# Patient Record
Sex: Male | Born: 1970 | Race: Black or African American | Hispanic: No | Smoking: Never smoker
Health system: Southern US, Community
[De-identification: ages and names within clinical notes are randomized; demographics above are authoritative.]

## PROBLEM LIST (undated history)

## (undated) DIAGNOSIS — I10 Essential (primary) hypertension: Secondary | ICD-10-CM

## (undated) DIAGNOSIS — R7611 Nonspecific reaction to tuberculin skin test without active tuberculosis: Secondary | ICD-10-CM

---

## 2019-11-05 ENCOUNTER — Ambulatory Visit: Payer: Self-pay | Attending: Internal Medicine

## 2021-11-15 ENCOUNTER — Inpatient Hospital Stay (HOSPITAL_COMMUNITY)
Admission: EM | Admit: 2021-11-15 | Discharge: 2021-11-18 | DRG: 871 | Disposition: A | Discharge status: Home / Self Care | Payer: Managed Care, Other (non HMO) | Attending: Internal Medicine | Admitting: Internal Medicine

## 2021-11-15 ENCOUNTER — Emergency Department (HOSPITAL_COMMUNITY): Payer: Managed Care, Other (non HMO)

## 2021-11-15 ENCOUNTER — Other Ambulatory Visit: Payer: Self-pay

## 2021-11-15 ENCOUNTER — Encounter (HOSPITAL_COMMUNITY): Payer: Self-pay

## 2021-11-15 DIAGNOSIS — R0602 Shortness of breath: Secondary | ICD-10-CM | POA: Diagnosis not present

## 2021-11-15 DIAGNOSIS — I509 Heart failure, unspecified: Secondary | ICD-10-CM

## 2021-11-15 DIAGNOSIS — E66813 Obesity, class 3: Secondary | ICD-10-CM | POA: Diagnosis present

## 2021-11-15 DIAGNOSIS — A4189 Other specified sepsis: Principal | ICD-10-CM | POA: Diagnosis present

## 2021-11-15 DIAGNOSIS — E119 Type 2 diabetes mellitus without complications: Secondary | ICD-10-CM

## 2021-11-15 DIAGNOSIS — N182 Chronic kidney disease, stage 2 (mild): Secondary | ICD-10-CM | POA: Diagnosis present

## 2021-11-15 DIAGNOSIS — E1165 Type 2 diabetes mellitus with hyperglycemia: Secondary | ICD-10-CM

## 2021-11-15 DIAGNOSIS — I161 Hypertensive emergency: Secondary | ICD-10-CM | POA: Diagnosis present

## 2021-11-15 DIAGNOSIS — I5021 Acute systolic (congestive) heart failure: Secondary | ICD-10-CM | POA: Diagnosis present

## 2021-11-15 DIAGNOSIS — N179 Acute kidney failure, unspecified: Secondary | ICD-10-CM | POA: Diagnosis present

## 2021-11-15 DIAGNOSIS — I10 Essential (primary) hypertension: Secondary | ICD-10-CM

## 2021-11-15 DIAGNOSIS — E876 Hypokalemia: Secondary | ICD-10-CM | POA: Diagnosis present

## 2021-11-15 DIAGNOSIS — Z20822 Contact with and (suspected) exposure to covid-19: Secondary | ICD-10-CM | POA: Diagnosis present

## 2021-11-15 DIAGNOSIS — R0600 Dyspnea, unspecified: Secondary | ICD-10-CM | POA: Diagnosis present

## 2021-11-15 DIAGNOSIS — B9789 Other viral agents as the cause of diseases classified elsewhere: Secondary | ICD-10-CM | POA: Diagnosis present

## 2021-11-15 DIAGNOSIS — B9781 Human metapneumovirus as the cause of diseases classified elsewhere: Secondary | ICD-10-CM | POA: Diagnosis present

## 2021-11-15 DIAGNOSIS — E785 Hyperlipidemia, unspecified: Secondary | ICD-10-CM | POA: Diagnosis present

## 2021-11-15 DIAGNOSIS — R051 Acute cough: Secondary | ICD-10-CM

## 2021-11-15 DIAGNOSIS — Z6841 Body Mass Index (BMI) 40.0 and over, adult: Secondary | ICD-10-CM

## 2021-11-15 DIAGNOSIS — E1122 Type 2 diabetes mellitus with diabetic chronic kidney disease: Secondary | ICD-10-CM | POA: Diagnosis present

## 2021-11-15 DIAGNOSIS — B348 Other viral infections of unspecified site: Secondary | ICD-10-CM | POA: Diagnosis present

## 2021-11-15 DIAGNOSIS — I130 Hypertensive heart and chronic kidney disease with heart failure and stage 1 through stage 4 chronic kidney disease, or unspecified chronic kidney disease: Secondary | ICD-10-CM | POA: Diagnosis present

## 2021-11-15 HISTORY — DX: Nonspecific reaction to tuberculin skin test without active tuberculosis: R76.11

## 2021-11-15 HISTORY — DX: Essential (primary) hypertension: I10

## 2021-11-15 LAB — BASIC METABOLIC PANEL
Anion gap: 8 (ref 5–15)
BUN: 21 mg/dL — ABNORMAL HIGH (ref 6–20)
CO2: 27 mmol/L (ref 22–32)
Calcium: 9 mg/dL (ref 8.9–10.3)
Chloride: 102 mmol/L (ref 98–111)
Creatinine, Ser: 1.84 mg/dL — ABNORMAL HIGH (ref 0.61–1.24)
GFR, Estimated: 44 mL/min — ABNORMAL LOW (ref >60)
Glucose, Bld: 180 mg/dL — ABNORMAL HIGH (ref 70–99)
Potassium: 3.4 mmol/L — ABNORMAL LOW (ref 3.5–5.1)
Sodium: 137 mmol/L (ref 135–145)

## 2021-11-15 LAB — BRAIN NATRIURETIC PEPTIDE: B Natriuretic Peptide: 164.9 pg/mL — ABNORMAL HIGH (ref 0.0–100.0)

## 2021-11-15 LAB — CBC WITH DIFFERENTIAL/PLATELET
Abs Immature Granulocytes: 0.02 10*3/uL (ref 0.00–0.07)
Basophils Absolute: 0 10*3/uL (ref 0.0–0.1)
Basophils Relative: 1 %
Eosinophils Absolute: 0.1 10*3/uL (ref 0.0–0.5)
Eosinophils Relative: 2 %
HCT: 39.5 % (ref 39.0–52.0)
Hemoglobin: 13.8 g/dL (ref 13.0–17.0)
Immature Granulocytes: 0 %
Lymphocytes Relative: 20 %
Lymphs Abs: 1.1 10*3/uL (ref 0.7–4.0)
MCH: 30.5 pg (ref 26.0–34.0)
MCHC: 34.9 g/dL (ref 30.0–36.0)
MCV: 87.4 fL (ref 80.0–100.0)
Monocytes Absolute: 0.4 10*3/uL (ref 0.1–1.0)
Monocytes Relative: 7 %
Neutro Abs: 4 10*3/uL (ref 1.7–7.7)
Neutrophils Relative %: 70 %
Platelets: 127 10*3/uL — ABNORMAL LOW (ref 150–400)
RBC: 4.52 MIL/uL (ref 4.22–5.81)
RDW: 14.3 % (ref 11.5–15.5)
WBC: 5.7 10*3/uL (ref 4.0–10.5)
nRBC: 0 % (ref 0.0–0.2)

## 2021-11-15 LAB — RESP PANEL BY RT-PCR (FLU A&B, COVID) ARPGX2
Influenza A by PCR: NEGATIVE
Influenza B by PCR: NEGATIVE
SARS Coronavirus 2 by RT PCR: NEGATIVE

## 2021-11-15 LAB — TROPONIN I (HIGH SENSITIVITY)
Troponin I (High Sensitivity): 49 ng/L — ABNORMAL HIGH (ref <18)
Troponin I (High Sensitivity): 46 ng/L — ABNORMAL HIGH (ref <18)

## 2021-11-15 MED ORDER — POTASSIUM CHLORIDE CRYS ER 20 MEQ PO TBCR
40.0000 meq | EXTENDED_RELEASE_TABLET | Freq: Two times a day (BID) | ORAL | Status: AC
Start: 2021-11-15 — End: 2021-11-16
  Administered 2021-11-16 (×2): 40 meq via ORAL
  Filled 2021-11-15 (×2): qty 2

## 2021-11-15 MED ORDER — ALBUTEROL SULFATE (2.5 MG/3ML) 0.083% IN NEBU
3.0000 mL | INHALATION_SOLUTION | RESPIRATORY_TRACT | Status: DC | PRN
  Administered 2021-11-16 (×2): 3 mL via RESPIRATORY_TRACT
  Filled 2021-11-15 (×2): qty 3

## 2021-11-15 MED ORDER — ENOXAPARIN SODIUM 40 MG/0.4ML IJ SOSY
40.0000 mg | PREFILLED_SYRINGE | INTRAMUSCULAR | Status: DC
  Administered 2021-11-16 – 2021-11-17 (×2): 40 mg via SUBCUTANEOUS
  Filled 2021-11-15 (×3): qty 0.4

## 2021-11-15 MED ORDER — FUROSEMIDE 10 MG/ML IJ SOLN: 40.0000 mg | Freq: Every day | INTRAMUSCULAR | Status: DC

## 2021-11-15 MED ORDER — ACETAMINOPHEN 500 MG PO TABS
1000.0000 mg | ORAL_TABLET | Freq: Once | ORAL | Status: AC
  Administered 2021-11-15: 1000 mg via ORAL
  Filled 2021-11-15: qty 2

## 2021-11-15 MED ORDER — INSULIN ASPART 100 UNIT/ML IJ SOLN
0.0000 [IU] | Freq: Three times a day (TID) | INTRAMUSCULAR | Status: DC
  Administered 2021-11-16: 2 [IU] via SUBCUTANEOUS
  Administered 2021-11-16: 3 [IU] via SUBCUTANEOUS
  Administered 2021-11-17: 1 [IU] via SUBCUTANEOUS
  Administered 2021-11-17 – 2021-11-18 (×3): 2 [IU] via SUBCUTANEOUS
  Administered 2021-11-18: 1 [IU] via SUBCUTANEOUS

## 2021-11-15 NOTE — Assessment & Plan Note (Signed)
K of 3.4.  Replete with oral potassium x2 ?

## 2021-11-15 NOTE — ED Provider Notes (Addendum)
?[facility] EMERGENCY DEPARTMENT ?Provider Note ? ? ?CSN: [id number] ?Arrival date & time: 11/15/21  1631 ? ?  ? ?History ?Chief Complaint  ?Patient presents with  ? Shortness of Breath  ? ? ?Kevin Ingram is a 51 y.o. male with history of hypertension, prediabetes, and obesity presenting to the ED for shortness of breath.  Patient reports he has become more short of breath with walking short distances over the last week.  Feels like the wind knocked out of him, he gets nauseous, sweaty, and his chest feels somewhat tight.  He additionally reports this is gotten worse over the last week since he developed a cough, congestion, and chills.  He noticed the shortness of breath happening with exertion this beginning of this year and it seemed like he was getting stronger and the shortness of breath was getting better up until this last week.  Patient denies any active chest pain or active shortness of breath.  He feels better sleeping sitting up and lying down in bed.  He has been sleeping sitting on the floor with his back up against the bed.  When he lays flat he says it makes the cough happen. ? ?Patient drives a lot for work.  Denies any history of blood clots.  Denies any estrogen usage.  Denies any bilateral leg swelling or leg pain. ? ? ?Shortness of Breath ?Associated symptoms: cough and fever   ? ?  ? ?Home Medications ?Prior to Admission medications   ?Not on File  ?   ? ?Allergies    ?Patient has no known allergies.   ? ?Review of Systems   ?Review of Systems  ?Constitutional:  Positive for activity change, chills and fever.  ?Respiratory:  Positive for cough, chest tightness and shortness of breath.   ?Cardiovascular:  Positive for leg swelling.  ? ?Physical Exam ?Updated Vital Signs ?BP (!) 165/81   Pulse 84   Temp (!) 100.9 ?F (38.3 ?C) (Oral)   Resp 15   SpO2 99%  ?Physical Exam ?Constitutional:   ?   Appearance: He is obese.  ?   Comments: daiphoretic  ?Cardiovascular:  ?   Rate and  Rhythm: Normal rate and regular rhythm.  ?Pulmonary:  ?   Effort: Pulmonary effort is normal. No tachypnea.  ?   Breath sounds: No decreased breath sounds.  ?Chest:  ?   Chest wall: No tenderness.  ?Musculoskeletal:  ?   Right lower leg: Edema present.  ?   Left lower leg: Edema present.  ?Skin: ?   General: Skin is warm.  ?Neurological:  ?   Mental Status: He is alert and oriented to person, place, and time.  ?Psychiatric:     ?   Mood and Affect: Mood normal.     ?   Behavior: Behavior normal.  ? ? ?ED Results / Procedures / Treatments   ?Labs ?(all labs ordered are listed, but only abnormal results are displayed) ?Labs Reviewed  ?BASIC METABOLIC PANEL - Abnormal; Notable for the following components:  ?    Result Value  ? Potassium 3.4 (*)   ? Glucose, Bld 180 (*)   ? BUN 21 (*)   ? Creatinine, Ser 1.84 (*)   ? GFR, Estimated 44 (*)   ? All other components within normal limits  ?CBC WITH DIFFERENTIAL/PLATELET - Abnormal; Notable for the following components:  ? Platelets 127 (*)   ? All other components within normal limits  ?BRAIN NATRIURETIC PEPTIDE - Abnormal; Notable  for the following components:  ? B Natriuretic Peptide 164.9 (*)   ? All other components within normal limits  ?TROPONIN I (HIGH SENSITIVITY) - Abnormal; Notable for the following components:  ? Troponin I (High Sensitivity) 49 (*)   ? All other components within normal limits  ?RESP PANEL BY RT-PCR (FLU A&B, COVID) ARPGX2  ? ? ?EKG ?EKG Interpretation ? ?Date/Time:  Thursday November 15 2021 16:29:53 EDT ?Ventricular Rate:  96 ?PR Interval:  136 ?QRS Duration: 86 ?QT Interval:  350 ?QTC Calculation: 442 ?R Axis:   109 ?Text Interpretation: Normal sinus rhythm Rightward axis ST & T wave abnormality, consider inferolateral ischemia Abnormal ECG No previous ECGs available No prior tracing Confirmed by Nanda Quinton [phone]) on 11/15/2021 7:42:05 PM ? ?Radiology ?DG Chest 2 View ? ?Result Date: 11/15/2021 ?CLINICAL DATA:  Shortness of breath. EXAM: CHEST -  2 VIEW COMPARISON:  None. FINDINGS: The cardiac silhouette is enlarged. The lungs are clear. There is no pleural effusion or pneumothorax. No acute fractures are seen. IMPRESSION: 1. Cardiomegaly. 2. Lungs are clear. Electronically Signed   By: Ronney Asters M.D.   On: 11/15/2021 17:31   ? ?Procedures ?Procedures  ? ?Medications Ordered in ED ?Medications  ?albuterol (PROVENTIL) (2.5 MG/3ML) 0.083% nebulizer solution 3 mL (has no administration in time range)  ?acetaminophen (TYLENOL) tablet 1,000 mg (1,000 mg Oral Given 11/15/21 1654)  ? ? ?ED Course/ Medical Decision Making/ A&P ?Clinical Course as of 11/15/21 2041  ?Thu Nov 15, 2021  ?2010 Cardiomegaly on CXR.  [RK]  ?  ?Clinical Course User Index ?[RK] Lupita Dawn, MD  ? ?                        ?Medical Decision Making ?Amount and/or Complexity of Data Reviewed ?Labs: ordered. ?Radiology: ordered. ? ?Risk ?OTC drugs. ?Prescription drug management. ?Decision regarding hospitalization. ? ?51 year old male with history of prediabetes, hypertension, and obesity presenting to the ED for shortness of breath worsening in the setting of cough, congestion.  Patient describes worsening shortness of breath with exertion has been ongoing since beginning of this year.  When patient gets short of breath on exertion he feels sweaty, nauseous, and some chest tightness. ? ?On chart review, patient's creatinine of 1.26 in October 2021.  Patient had been taking amlodipine at that time but had not started his HCTZ/lisinopril combo. ? ?Chest x-ray shows cardiomegaly.  COVID flu negative.  Platelets 127.  Potassium 3.4, glucose 180, BUN 21, creatinine of 1.84, GFR 44 .  BNP 164.9, troponin 49.  EKG with ST-T wave abnormalities. ? ?Due to concern for underlying cardiac etiology given poorly managed hypertension, will admit for concern for underlying ACS as well as AKI versus new onset chronic CKD verus new onset CHF. ? ?Spoke with hospitalist who agrees with admission.  Care  transition to their team. ? ?Patient seen conjunction with my attending Dr. Laverta Baltimore.  ? ?Final Clinical Impression(s) / ED Diagnoses ?Final diagnoses:  ?Shortness of breath  ?Acute cough  ?AKI (acute kidney injury) ([place])  ? ? ?Rx / DC Orders ?ED Discharge Orders   ? ? None  ? ?  ? ? ? ?  ?Lupita Dawn, MD ?11/15/21 2302 ? ?  ?Margette Fast, MD ?11/16/21 1219 ? ?

## 2021-11-15 NOTE — Assessment & Plan Note (Addendum)
Patient was admitted to the cardiac ward and underwent diuresis with IV furosemide, negative fluid balanced was achieved with improvement in her symptoms.  ? ?Echocardiogram with preserved LV systolic function, EF 50 to 55%, with severe LVH, preserved RV systolic function and no significant valvular disease.  ? ?At his discharge patient will continue with furosemide 40 mg daily for diuresis, continue blood pressure control with amlodipine and hydralazine. ?Added SGLT2i. ? ?Follow with primary care within 7 days.  ? ? ?

## 2021-11-15 NOTE — Assessment & Plan Note (Addendum)
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  ? ?At the time of his discharge his renal function has a serum cr of 1,51, K 3,3 and serum bicarbonate at 26. ?His volume has improved.  ?Plan to continue with furosemide and K supplements, follow up renal function and electrolytes as outpatient within the next 7 days.  ?

## 2021-11-15 NOTE — ED Notes (Signed)
Pt's oxygen stayed above 95% while ambulating. Pt reports shob while ambulating. ?

## 2021-11-15 NOTE — ED Notes (Signed)
Unable to draw labs x2 

## 2021-11-15 NOTE — ED Provider Triage Note (Signed)
Emergency Medicine Provider Triage Evaluation Note ? ?Kevin Ingram , a 51 y.o. male  was evaluated in triage.  Pt complains of shortness of breath onset January.  He has associated cough onset 1 week, rhinorrhea, nasal congestion.  Has tried NyQuil with his last dose being last night.  Denies chest pain.  Patient has a history of hypertension however has not been taking his antihypertensives. ? ?Review of Systems  ?Positive: As per HPI above ?Negative:  ? ?Physical Exam  ?BP (!) 237/137 (BP Location: Right Arm)   Pulse (!) 110   Temp (!) 100.9 ?F (38.3 ?C) (Oral)   Resp (!) 26   SpO2 95%  ?Gen:   Awake, no distress   ?Resp:  Normal effort wheezing noted throughout all lung fields. ?MSK:   Moves extremities without difficulty  ?Other:   ? ?Medical Decision Making  ?Medically screening exam initiated at 4:40 PM.  Appropriate orders placed.  Kevin Ingram was informed that the remainder of the evaluation will be completed by another provider, this initial triage assessment does not replace that evaluation, and the importance of remaining in the ED until their evaluation is complete. ? ?  ?Kevin Ingram A, PA-C ?11/15/21 1641 ? ?

## 2021-11-15 NOTE — Assessment & Plan Note (Addendum)
Positive for metapneumovirus, no clinical signs of viral pneumonia.  ?(sepsis criteria present on admission).  ? ? ?

## 2021-11-15 NOTE — ED Triage Notes (Signed)
Pt reports SOB since January and a cough that started last week associated with congestion. CP only when he coughs.  ?

## 2021-11-15 NOTE — H&P (Signed)
?History and Physical  ? ? ?PatientPaz Ingram [id number] DOB: 1970-09-25 ?DOA: 11/15/2021 ?DOS: the patient was seen and examined on 11/15/2021 ?PCP: Pcp, No  ?Patient coming from: Home ? ?Chief Complaint:  ?Chief Complaint  ?Patient presents with  ? Shortness of Breath  ? ?HPI: Kevin Ingram is a 51 y.o. male with medical history significant of HTN, type 2 diabetes, obesity and hyperlipidemia ?Who presents with concerns of increasing cough and shortness of breath. ? ?He reports increasing productive cough for the past 3 days and has been having dyspnea on exertion for at least 3 to 4 months associated with weakness.  Has felt winded just walking across a parking lot.  Denies any chest pain or palpitation.  Denies orthopnea but usually sleeps reclined.  Denies any lower extremity edema.   ?Reports that he quit taking all all of his medications for least the past several months. ?Denies any tobacco or illicit drug use. ? ?In the ED, he was febrile up to 100.70F, initially hypertensive up to 237/103 on room air.  No leukocytosis.  Platelet of 127.  Sodium of 137, K of 3.4, creatinine of 1.84, CBG of 180. ?Troponin of 49, BNP of 164. ?EKG on my review with sinus rhythm and inverted T waves at V5 to 6. ?Chest x-ray shows cardiomegaly but no overt pulmonary edema. ? ?Hospitalist was called for further management. ? ? ? ?Review of Systems: As mentioned in the history of present illness. All other systems reviewed and are negative. ?Past Medical History:  ?Diagnosis Date  ? Hypertension   ? Positive TB test   ? 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 ?History reviewed. No pertinent surgical history. ?Social History:  has no history on file for tobacco use, alcohol use, and drug use. ? ?No Known Allergies ? ?No family history on file. ? ?Prior to Admission medications   ?Medication Sig Start Date End Date Taking? Authorizing Provider  ?DM-Doxylamine-Acetaminophen (NYQUIL HBP COLD & FLU) 15-6.25-325  MG/15ML LIQD Take 15 mLs by mouth every 6 (six) hours as needed (cold and cough).   Yes [provider]  ? ? ?Physical Exam: ?Vitals:  ? 11/15/21 1938 11/15/21 2000 11/15/21 2015 11/15/21 2045  ?BP: (!) 159/99 (!) 167/89 (!) 165/81 (!) 154/88  ?Pulse: 98 86 84 89  ?Resp: 11 13 15 13   ?Temp:      ?TempSrc:      ?SpO2: 100% 99% 99% 99%  ? ?Constitutional: NAD, calm, comfortable, well-appearing obese male sitting upright in bed ?Eyes:  lids and conjunctivae normal ?ENMT: Mucous membranes are moist.  ?Neck: normal, supple ?Respiratory: clear to auscultation bilaterally, no wheezing, no crackles on room air. Normal respiratory effort. No accessory muscle use.  ?Cardiovascular: Regular rate and rhythm, no murmurs / rubs / gallops.  +2-3 pitting edema bilateral lower extremity up to mid pretibial region. ?Abdomen: no tenderness,  Bowel sounds positive.  ?Musculoskeletal: no clubbing / cyanosis. No joint deformity upper and lower extremities. Good ROM, no contractures. Normal muscle tone.  ?Skin: no rashes, lesions, ulcers. No induration ?Neurologic: CN 2-12 grossly intact. Strength 5/5 in all 4.  ?Psychiatric: Normal judgment and insight. Alert and oriented x 3. Normal mood. ?Data Reviewed: ? ?See HPI ? ?Assessment and Plan: ?CHF (congestive heart failure) ([place]) ?Presented with several months of dyspnea with LE edema and cardiomegaly seen on CXR after discontinuing his antihypertensive on his own. ?Suspect new onset CHF due to uncontrolled HTN due to medication nonadherence ?-Start  IV daily 40 mg Lasix ?-Strict intake and output, daily weights ?-Obtain echocardiogram ? ?Sepsis, viral ([place]) ?Presented with fever and tachycardia with heart rates up to 110.  Symptoms of coughing recently started 3 days ago.  Suspect this is a separate viral process apart from his new onset CHF.  No other signs or symptoms to suggest any myocarditis or pulmonary embolism. ?-Unable to receive 30 cc/kg fluid due to need for  diuresis ?-Flu/COVID is negative.  Check RVP. ? ?Hypokalemia ?K of 3.4.  Replete with oral potassium x2 ? ?AKI (acute kidney injury) ([place]) ?Creatinine elevated 1.84 with no recent prior for comparison.  Will monitor while receiving diuresis. ? ?Type 2 diabetes mellitus ([place]) ?Obtain new hemoglobin A1c.  Start sliding scale. ? ?HTN (hypertension) ?Uncontrolled but has improved in the ED without intervention.  Been uncontrolled at baseline due to nonadherence with lisinopril. ?-Monitor on IV Lasix.  Hold ACE inhibitor for now due to AKI ? ? ? ? ? Advance Care Planning:   Code Status: Full Code  ? ?Consults: none ? ?Family Communication: No family at bedside ? ?Severity of Illness: ?The appropriate patient status for this patient is OBSERVATION. Observation status is judged to be reasonable and necessary in order to provide the required intensity of service to ensure the patient's safety. The patient's presenting symptoms, physical exam findings, and initial radiographic and laboratory data in the context of their medical condition is felt to place them at decreased risk for further clinical deterioration. Furthermore, it is anticipated that the patient will be medically stable for discharge from the hospital within 2 midnights of admission.  ? ?Author: Orene Desanctis, DO ?11/15/2021 11:24 PM ? ?For on call review [email or web address].  ?

## 2021-11-15 NOTE — Assessment & Plan Note (Addendum)
Hgb A1c is 8,2. ?Fasting glucose is 184. ? ?His fasting glucose is 139 mg/dl ?Patient was placed on insulin sliding scale during his hospitalization. ?At the time of his discharge with start with metformin. ?Advices about life style modifications.  ?

## 2021-11-15 NOTE — Assessment & Plan Note (Addendum)
Hypertensive emergency.  ? ?His discharge blood pressure is 149/81. ?Plan to continue blood pressure control with amlodipine, hydralazine and diuretic therapy with furosemide.  ? ?Follow up with primary care in 7 days.  ?

## 2021-11-16 ENCOUNTER — Observation Stay (HOSPITAL_COMMUNITY): Payer: Managed Care, Other (non HMO)

## 2021-11-16 ENCOUNTER — Encounter (HOSPITAL_COMMUNITY): Payer: Self-pay | Admitting: Family Medicine

## 2021-11-16 DIAGNOSIS — B348 Other viral infections of unspecified site: Secondary | ICD-10-CM | POA: Diagnosis not present

## 2021-11-16 DIAGNOSIS — I5021 Acute systolic (congestive) heart failure: Secondary | ICD-10-CM

## 2021-11-16 DIAGNOSIS — I130 Hypertensive heart and chronic kidney disease with heart failure and stage 1 through stage 4 chronic kidney disease, or unspecified chronic kidney disease: Secondary | ICD-10-CM | POA: Diagnosis present

## 2021-11-16 DIAGNOSIS — N179 Acute kidney failure, unspecified: Secondary | ICD-10-CM | POA: Diagnosis present

## 2021-11-16 DIAGNOSIS — E785 Hyperlipidemia, unspecified: Secondary | ICD-10-CM | POA: Diagnosis present

## 2021-11-16 DIAGNOSIS — R0602 Shortness of breath: Secondary | ICD-10-CM | POA: Diagnosis present

## 2021-11-16 DIAGNOSIS — B9789 Other viral agents as the cause of diseases classified elsewhere: Secondary | ICD-10-CM | POA: Diagnosis present

## 2021-11-16 DIAGNOSIS — I161 Hypertensive emergency: Secondary | ICD-10-CM | POA: Diagnosis present

## 2021-11-16 DIAGNOSIS — I10 Essential (primary) hypertension: Secondary | ICD-10-CM | POA: Diagnosis not present

## 2021-11-16 DIAGNOSIS — E876 Hypokalemia: Secondary | ICD-10-CM | POA: Diagnosis present

## 2021-11-16 DIAGNOSIS — Z20822 Contact with and (suspected) exposure to covid-19: Secondary | ICD-10-CM | POA: Diagnosis present

## 2021-11-16 DIAGNOSIS — Z6841 Body Mass Index (BMI) 40.0 and over, adult: Secondary | ICD-10-CM | POA: Diagnosis not present

## 2021-11-16 DIAGNOSIS — I509 Heart failure, unspecified: Secondary | ICD-10-CM | POA: Diagnosis not present

## 2021-11-16 DIAGNOSIS — A4189 Other specified sepsis: Secondary | ICD-10-CM | POA: Diagnosis present

## 2021-11-16 DIAGNOSIS — B9781 Human metapneumovirus as the cause of diseases classified elsewhere: Secondary | ICD-10-CM | POA: Diagnosis present

## 2021-11-16 DIAGNOSIS — E1122 Type 2 diabetes mellitus with diabetic chronic kidney disease: Secondary | ICD-10-CM | POA: Diagnosis present

## 2021-11-16 DIAGNOSIS — N182 Chronic kidney disease, stage 2 (mild): Secondary | ICD-10-CM | POA: Diagnosis present

## 2021-11-16 LAB — RESPIRATORY PANEL BY PCR

## 2021-11-16 LAB — CBC
HCT: 37.6 % — ABNORMAL LOW (ref 39.0–52.0)
Hemoglobin: 13 g/dL (ref 13.0–17.0)
MCH: 30.1 pg (ref 26.0–34.0)
MCHC: 34.6 g/dL (ref 30.0–36.0)
MCV: 87 fL (ref 80.0–100.0)
Platelets: 123 10*3/uL — ABNORMAL LOW (ref 150–400)
RBC: 4.32 MIL/uL (ref 4.22–5.81)
RDW: 14.1 % (ref 11.5–15.5)
WBC: 5.2 10*3/uL (ref 4.0–10.5)
nRBC: 0 % (ref 0.0–0.2)

## 2021-11-16 LAB — BASIC METABOLIC PANEL
Anion gap: 7 (ref 5–15)
BUN: 20 mg/dL (ref 6–20)
CO2: 26 mmol/L (ref 22–32)
Calcium: 8.8 mg/dL — ABNORMAL LOW (ref 8.9–10.3)
Chloride: 104 mmol/L (ref 98–111)
Creatinine, Ser: 1.66 mg/dL — ABNORMAL HIGH (ref 0.61–1.24)
GFR, Estimated: 50 mL/min — ABNORMAL LOW (ref >60)
Glucose, Bld: 184 mg/dL — ABNORMAL HIGH (ref 70–99)
Potassium: 3.5 mmol/L (ref 3.5–5.1)
Sodium: 137 mmol/L (ref 135–145)

## 2021-11-16 LAB — HEMOGLOBIN A1C
Hgb A1c MFr Bld: 8.2 % — ABNORMAL HIGH (ref 4.8–5.6)
Mean Plasma Glucose: 188.64 mg/dL

## 2021-11-16 LAB — ECHOCARDIOGRAM COMPLETE
Area-P 1/2: 4.63 cm2
S' Lateral: 3.33 cm
Single Plane A4C EF: 43.1 %

## 2021-11-16 LAB — GLUCOSE, CAPILLARY
Glucose-Capillary: 112 mg/dL — ABNORMAL HIGH (ref 70–99)
Glucose-Capillary: 158 mg/dL — ABNORMAL HIGH (ref 70–99)

## 2021-11-16 LAB — CBG MONITORING, ED
Glucose-Capillary: 169 mg/dL — ABNORMAL HIGH (ref 70–99)
Glucose-Capillary: 231 mg/dL — ABNORMAL HIGH (ref 70–99)

## 2021-11-16 LAB — PROCALCITONIN: Procalcitonin: 0.27 ng/mL

## 2021-11-16 MED ORDER — FUROSEMIDE 10 MG/ML IJ SOLN
40.0000 mg | Freq: Two times a day (BID) | INTRAMUSCULAR | Status: AC
  Administered 2021-11-16 (×2): 40 mg via INTRAVENOUS
  Filled 2021-11-16 (×2): qty 4

## 2021-11-16 MED ORDER — HYDRALAZINE HCL 25 MG PO TABS
25.0000 mg | ORAL_TABLET | Freq: Three times a day (TID) | ORAL | Status: DC
  Administered 2021-11-16 – 2021-11-17 (×4): 25 mg via ORAL
  Filled 2021-11-16 (×4): qty 1

## 2021-11-16 MED ORDER — BENZONATATE 100 MG PO CAPS
100.0000 mg | ORAL_CAPSULE | Freq: Three times a day (TID) | ORAL | Status: DC
  Administered 2021-11-16 – 2021-11-18 (×7): 100 mg via ORAL
  Filled 2021-11-16 (×7): qty 1

## 2021-11-16 MED ORDER — GUAIFENESIN-DM 100-10 MG/5ML PO SYRP
5.0000 mL | ORAL_SOLUTION | ORAL | Status: DC | PRN
  Administered 2021-11-16: 5 mL via ORAL
  Filled 2021-11-16: qty 5

## 2021-11-16 MED ORDER — HYDRALAZINE HCL 20 MG/ML IJ SOLN: 10.0000 mg | Freq: Four times a day (QID) | INTRAMUSCULAR | Status: DC | PRN

## 2021-11-16 NOTE — TOC Progression Note (Signed)
Transition of Care (TOC) - Progression Note  ? ? ?Patient Details  ?Name: Kevin Ingram ?MRN: [id number] ?Date of Birth: 1970/08/15 ? ?Transition of Care (TOC) CM/SW Contact  ?Leone Haven, RN ?Phone Number: ?11/16/2021, 4:35 PM ? ?Clinical Narrative:    ? ?Transition of Care (TOC) Screening Note ? ? ?Patient Details  ?Name: Kevin Ingram ?Date of Birth: 22-Jun-1971 ? ? ?Transition of Care (TOC) CM/SW Contact:    ?Leone Haven, RN ?Phone Number: ?11/16/2021, 4:35 PM ? ? ? ?Transition of Care Department [facility]) has reviewed patient and no TOC needs have been identified at this time. We will continue to monitor patient advancement through interdisciplinary progression rounds. If new patient transition needs arise, please place a TOC consult. ?  ? ? ?  ?  ? ?Expected Discharge Plan and Services ?  ?  ?  ?  ?  ?                ?  ?  ?  ?  ?  ?  ?  ?  ?  ?  ? ? ?Social Determinants of Health (SDOH) Interventions ?  ? ?Readmission Risk Interventions ?   ? View : No data to display.  ?  ?  ?  ? ? ?

## 2021-11-16 NOTE — ED Notes (Signed)
Pt requesting medication for his cough, PRN Robitussin DM ordered. ?

## 2021-11-16 NOTE — ED Notes (Signed)
Pt requesting neb treatment, PRN albuterol given. ?

## 2021-11-16 NOTE — Progress Notes (Signed)
?PROGRESS NOTE ? ? ? ?Kevin Ingram  QQP:[id number] DOB: [birth year] DOA: 11/15/2021 ?PCP: Pcp, No  ?Narrative: 50/M with history of obesity, hypertension, type 2 diabetes mellitus, dyslipidemia presented to the ED with increasing cough and shortness of breath. ?-Patient has noticed progressive dyspnea on exertion, easy fatigability for 3 to 4 months.  For the past 3 days he has had a congested cough as well. ?-In the ED he was febrile to 100.9, blood pressure was 237/103, labs noted creatinine of 1.8, troponin of 49, BNP 164, EKG with T wave inversion V5, V6, chest x-ray noted cardiomegaly, otherwise clear ? ? ?Subjective: ?-Feels better, was able to rest last night, still bothered by cough ? ?Assessment and Plan: ? ?New CHF suspected ?Uncontrolled hypertension ?-Several months of dyspnea, scant edema, cardiomegaly etc. ?-Continue IV Lasix today ?-Add [place] and other GDMT once CHF confirmed ?-Follow-up 2D echocardiogram ?-Monitor I's/O, daily weights ? ?URI, bronchitis ?-Symptoms for last 3 days in the background of above ?-Flu and COVID PCR negative, follow-up respiratory virus panel ?-Add antitussives and bronchodilators ? ?Hypokalemia ?-Repleted ? ?AKI (acute kidney injury) (HCC) ?Creatinine elevated 1.84 with no recent prior for comparison.  ?-Improving, monitor ? ?Type 2 diabetes mellitus (HCC) ?-Add Marcelline Deist once CHF confirmed ? ?HTN (hypertension) ?Uncontrolled but has improved in the ED without intervention.  Been uncontrolled at baseline due to nonadherence with lisinopril. ?-Add hydralazine ? ?Obesity ?-Needs lifestyle modification, sleep study ? ?DVT prophylaxis: Lovenox ?Code Status: Full code ?Family Communication: Discussed with patient in detail, no family at bedside ?Disposition Plan: Home pending above work-up ? ?Procedures:  ? ?Antimicrobials:  ? ? ?Objective: ?Vitals:  ? 11/15/21 2045 11/16/21 0046 11/16/21 0413 11/16/21 0759  ?BP: (!) 154/88 (!) 165/88 (!) 166/87 (!) 178/99  ?Pulse: 89 87 78 89   ?Resp: 13 16 18 17   ?Temp:   98.8 ?F (37.1 ?C)   ?TempSrc:   Oral   ?SpO2: 99% 99% 98% 98%  ? ?No intake or output data in the 24 hours ending 11/16/21 1048 ?There were no vitals filed for this visit. ? ?Examination: ? ?General exam: Appears calm and comfortable  ?Respiratory system: Clear to auscultation ?Cardiovascular system: S1 & S2 heard, RRR.  ?Abd: nondistended, soft and nontender.Normal bowel sounds heard. ?Central nervous system: Alert and oriented. No focal neurological deficits. ?Extremities: Sno edema ?Skin: No rashes ?Psychiatry: Judgement and insight appear normal. Mood & affect appropriate.  ? ? ? ?Data Reviewed:  ? ?CBC: ?Recent Labs  ?Lab 11/15/21 ?1923 11/16/21 ?0405  ?WBC 5.7 5.2  ?NEUTROABS 4.0  --   ?HGB 13.8 13.0  ?HCT 39.5 37.6*  ?MCV 87.4 87.0  ?PLT 127* 123*  ? ?Basic Metabolic Panel: ?Recent Labs  ?Lab 11/15/21 ?1923 11/16/21 ?0405  ?NA 137 137  ?K 3.4* 3.5  ?CL 102 104  ?CO2 27 26  ?GLUCOSE 180* 184*  ?BUN 21* 20  ?CREATININE 1.84* 1.66*  ?CALCIUM 9.0 8.8*  ? ?GFR: ?CrCl cannot be calculated (Unknown ideal weight.). ?Liver Function Tests: ?No results for input(s): AST, ALT, ALKPHOS, BILITOT, PROT, ALBUMIN in the last 168 hours. ?No results for input(s): LIPASE, AMYLASE in the last 168 hours. ?No results for input(s): AMMONIA in the last 168 hours. ?Coagulation Profile: ?No results for input(s): INR, PROTIME in the last 168 hours. ?Cardiac Enzymes: ?No results for input(s): CKTOTAL, CKMB, CKMBINDEX, TROPONINI in the last 168 hours. ?BNP (last 3 results) ?No results for input(s): PROBNP in the last 8760 hours. ?HbA1C: ?Recent Labs  ?  11/16/21 ?0405  ?  HGBA1C 8.2*  ? ?CBG: ?Recent Labs  ?Lab 11/16/21 ?0747  ?GLUCAP 169*  ? ?Lipid Profile: ?No results for input(s): CHOL, HDL, LDLCALC, TRIG, CHOLHDL, LDLDIRECT in the last 72 hours. ?Thyroid Function Tests: ?No results for input(s): TSH, T4TOTAL, FREET4, T3FREE, THYROIDAB in the last 72 hours. ?Anemia Panel: ?No results for input(s):  VITAMINB12, FOLATE, FERRITIN, TIBC, IRON, RETICCTPCT in the last 72 hours. ?Urine analysis: ?No results found for: COLORURINE, APPEARANCEUR, LABSPEC, PHURINE, GLUCOSEU, HGBUR, BILIRUBINUR, KETONESUR, PROTEINUR, UROBILINOGEN, NITRITE, LEUKOCYTESUR ?Sepsis Labs: ?@LABRCNTIP (procalcitonin:4,lacticidven:4) ? ?) ?Recent Results (from the past 240 hour(s))  ?Resp Panel by RT-PCR (Flu A&B, Covid) Nasopharyngeal Swab     Status: None  ? Collection Time: 11/15/21  4:49 PM  ? Specimen: Nasopharyngeal Swab; Nasopharyngeal(NP) swabs in vial transport medium  ?Result Value Ref Range Status  ? SARS Coronavirus 2 by RT PCR NEGATIVE NEGATIVE Final  ?  Comment: (NOTE) ?SARS-CoV-2 target nucleic acids are NOT DETECTED. ? ?The SARS-CoV-2 RNA is generally detectable in upper respiratory ?specimens during the acute phase of infection. The lowest ?concentration of SARS-CoV-2 viral copies this assay can detect is ?138 copies/mL. A negative result does not preclude SARS-Cov-2 ?infection and should not be used as the sole basis for treatment or ?other patient management decisions. A negative result may occur with  ?improper specimen collection/handling, submission of specimen other ?than nasopharyngeal swab, presence of viral mutation(s) within the ?areas targeted by this assay, and inadequate number of viral ?copies(<138 copies/mL). A negative result must be combined with ?clinical observations, patient history, and epidemiological ?information. The expected result is Negative. ? ?Fact Sheet for Patients:  ?01/15/22 ? ?Fact Sheet for Healthcare Providers:  ?BloggerCourse.com ? ?This test is no t yet approved or cleared by the [employer] FDA and  ?has been authorized for detection and/or diagnosis of SARS-CoV-2 by ?FDA under an Emergency Use Authorization (EUA). This EUA will remain  ?in effect (meaning this test can be used) for the duration of the ?COVID-19 declaration under  Section 564(b)(1) of the Act, 21 ?U.S.C.section 360bbb-3(b)(1), unless the authorization is terminated  ?or revoked sooner.  ? ? ?  ? Influenza A by PCR NEGATIVE NEGATIVE Final  ? Influenza B by PCR NEGATIVE NEGATIVE Final  ?  Comment: (NOTE) ?The Xpert Xpress SARS-CoV-2/FLU/RSV plus assay is intended as an aid ?in the diagnosis of influenza from Nasopharyngeal swab specimens and ?should not be used as a sole basis for treatment. Nasal washings and ?aspirates are unacceptable for Xpert Xpress SARS-CoV-2/FLU/RSV ?testing. ? ?Fact Sheet for Patients: ?[place] ? ?Fact Sheet for Healthcare Providers: ?BloggerCourse.com ? ?This test is not yet approved or cleared by the [employer] FDA and ?has been authorized for detection and/or diagnosis of SARS-CoV-2 by ?FDA under an Emergency Use Authorization (EUA). This EUA will remain ?in effect (meaning this test can be used) for the duration of the ?COVID-19 declaration under Section 564(b)(1) of the Act, 21 U.S.C. ?section 360bbb-3(b)(1), unless the authorization is terminated or ?revoked. ? ?Performed at [facility], [address] [place], [place] ?[place] ?  ?  ? ?Radiology Studies: ?DG Chest 2 View ? ?Result Date: 11/15/2021 ?CLINICAL DATA:  Shortness of breath. EXAM: CHEST - 2 VIEW COMPARISON:  None. FINDINGS: The cardiac silhouette is enlarged. The lungs are clear. There is no pleural effusion or pneumothorax. No acute fractures are seen. IMPRESSION: 1. Cardiomegaly. 2. Lungs are clear. Electronically Signed   By: 01/15/2022 M.D.   On: 11/15/2021 17:31   ? ? ?Scheduled  Meds: ? benzonatate  100 mg Oral TID  ? enoxaparin (LOVENOX) injection  40 mg Subcutaneous Q24H  ? furosemide  40 mg Intravenous BID  ? hydrALAZINE  25 mg Oral TID  ? insulin aspart  0-9 Units Subcutaneous TID WC  ? ?Continuous Infusions: ? ? LOS: 0 days  ? ? ?Time spent: 35min ? ? ?Zannie CovePreetha Sienna Stonehocker, MD ?[facility] ? ? ?11/16/2021,  10:48 AM  ?  ?

## 2021-11-16 NOTE — Progress Notes (Signed)
?  Echocardiogram ?2D Echocardiogram has been performed. ? ?Kevin Ingram ?11/16/2021, 10:38 AM ?

## 2021-11-17 DIAGNOSIS — N179 Acute kidney failure, unspecified: Secondary | ICD-10-CM | POA: Diagnosis not present

## 2021-11-17 DIAGNOSIS — I509 Heart failure, unspecified: Secondary | ICD-10-CM | POA: Diagnosis not present

## 2021-11-17 DIAGNOSIS — B348 Other viral infections of unspecified site: Secondary | ICD-10-CM

## 2021-11-17 DIAGNOSIS — I10 Essential (primary) hypertension: Secondary | ICD-10-CM | POA: Diagnosis not present

## 2021-11-17 LAB — GLUCOSE, CAPILLARY
Glucose-Capillary: 151 mg/dL — ABNORMAL HIGH (ref 70–99)
Glucose-Capillary: 179 mg/dL — ABNORMAL HIGH (ref 70–99)
Glucose-Capillary: 128 mg/dL — ABNORMAL HIGH (ref 70–99)
Glucose-Capillary: 135 mg/dL — ABNORMAL HIGH (ref 70–99)

## 2021-11-17 MED ORDER — AMLODIPINE BESYLATE 10 MG PO TABS
10.0000 mg | ORAL_TABLET | Freq: Every day | ORAL | Status: DC
  Administered 2021-11-17 – 2021-11-18 (×2): 10 mg via ORAL
  Filled 2021-11-17 (×2): qty 1

## 2021-11-17 MED ORDER — FUROSEMIDE 40 MG PO TABS
40.0000 mg | ORAL_TABLET | Freq: Every day | ORAL | Status: DC
  Administered 2021-11-17 – 2021-11-18 (×2): 40 mg via ORAL
  Filled 2021-11-17 (×2): qty 1

## 2021-11-17 MED ORDER — HYDRALAZINE HCL 50 MG PO TABS
50.0000 mg | ORAL_TABLET | Freq: Two times a day (BID) | ORAL | Status: DC
  Administered 2021-11-17 – 2021-11-18 (×2): 50 mg via ORAL
  Filled 2021-11-17 (×2): qty 1

## 2021-11-17 NOTE — Hospital Course (Addendum)
Kevin Ingram was admitted to the hospital with the working diagnosis of decompensated heart failure, hypertensive emergency  ? ?51 yo male with the past medical history of hypertension, type 2 diabetes mellitus, obesity and dyslipidemia who presented with dyspnea. Reported 3 to 4 months of dyspnea on exertion, that progressed to severe productive cough for the last 3 days that prompted him to come to the hospital. He stop taking his medications several months ago. On his initial physical examination his blood pressure was 237/103, HR 98, RR 13 02 saturation 99%, lungs with no wheezing or rhonchi, heart with S1 and S2 present and rhythmic, with no gallops, abdomen not distended, positive lower extremity edema +++ pitting bilaterally.  ? ?Na 137, K 3,4 CL 102, bicarbonate 27, glucose 180 bun 21 cr 1,84 ?BNP 164 ?High sensitive troponin 49 and 46  ?Wbc 5.7 hgb 13,8 plt 127  ?Metapneumovirus positive ?SARS covid 19 negative ? ?Chest radiograph with cardiomegaly with bilateral hilar vascular congestion.  ? ?EKG 96 bpm, right axis, normal intervals, sinus rhythm, ST depression in V5 and V6, T wave inversions, lead II, III, AvF, V5 and V6. ? ?Patient was placed on furosemide for diuresis.  ? ?Echocardiogram with LV systolic function preserved with EF 50 to 55%, severe concentric hypertrophy, preserved RV systolic function.  ? ?Resume antihypertensive medications with improvement in his blood pressure, will need close follow up as outpatient.  ?

## 2021-11-17 NOTE — Progress Notes (Signed)
?Progress Note ? ? ?PatientNicholaus Ingram [id number] DOB: Sep 19, 1970 DOA: 11/15/2021     1 ?DOS: the patient was seen and examined on 11/17/2021 ?  ?Brief hospital course: ?[name] was admitted to the hospital with the working diagnosis of decompensated heart failure.  ? ?51 yo male with the past medical history of hypertension, type 2 diabetes mellitus, obesity and dyslipidemia who presented with dyspnea. Reported 3 to 4 months of dyspnea on exertion, that progressed to severe productive cough for the last 3 days that prompted him to come to the hospital. He stop taking his medications several months ago. On his initial physical examination his blood pressure was 237/103, HR 98, RR 13 02 saturation 99%, lungs with no wheezing or rhonchi, heart with S1 and S2 present and rhythmic, with no gallops, abdomen not distended, positive lower extremity edema +++ pitting bilaterally.  ? ?Na 137, K 3,4 CL 102, bicarbonate 27, glucose 180 bun 21 cr 1,84 ?BNP 164 ?High sensitive troponin 49 and 46  ?Wbc 5.7 hgb 13,8 plt 127  ?Metapneumovirus positive ?SARS covid 19 negative ? ?Chest radiograph with cardiomegaly with bilateral hilar vascular congestion.  ? ?EKG 96 bpm, right axis, normal intervals, sinus rhythm, ST depression in V5 and V6, T wave inversions, lead II, III, AvF, V5 and V6. ? ?Patient was placed on furosemide for diuresis.  ? ?Echocardiogram with LV systolic function preserved with EF 50 to 55%, severe concentric hypertrophy, preserved RV systolic function.  ? ?Assessment and Plan: ?* CHF (congestive heart failure) ([place]) ?Echocardiogram with preserved LV systolic function, EF 50 to 55%, with severe LVH, preserved RV systolic function and no significant valvular disease.  ? ?Plan to continue diuresis with furosemide 40 mg daily ?Blood pressure control with hydralazine and amlodipine.  ? ?Infection due to human metapneumovirus (hMPV) ?Positive for metapneumovirus, no clinical signs of viral pneumonia.  ?(sepsis  criteria present on admission).  ? ? ? ?AKI (acute kidney injury) ([place]) ?Baseline cr is 1,26 stage 2 CKD ?Hypokalemia.  ? ?Renal function with serum cr at 1,6 with K at 3,5 and serum bicarbonate at 26. ? ?Resume diuresis with furosemide and follow up renal function in am.  ?Avoid hypotension and nephrotoxic medications.  ? ?Type 2 diabetes mellitus ([place]) ?Hgb A1c is 8,2. ?Fasting glucose is 184. ?Continue insulin sliding scale for glucose cover and monitoring.  ? ?HTN (hypertension) ?Hypertensive emergency.  ? ?Systolic blood pressure is 150 to 160 mmHg.  ? ?Plan to continue blood pressure control with hydralazine and amlodipine.  ?If continue elevated may add B blockade. ?Will need ras inhibition when renal function more stable.  ? ? ? ? ?  ? ?Subjective: patient is feeling better, with no chest pain, edema and dyspnea are improving ? ?Physical Exam: ?Vitals:  ? 11/16/21 2024 11/17/21 0017 11/17/21 0422 11/17/21 [id number]  ?BP: (!) 163/95 (!) 161/97 (!) 151/58   ?Pulse: 97 87 87 88  ?Resp: 18 20 18 19   ?Temp: (!) 100.7 ?F (38.2 ?C) 99.5 ?F (37.5 ?C) 98.3 ?F (36.8 ?C) 98.8 ?F (37.1 ?C)  ?TempSrc: Oral Oral Oral Oral  ?SpO2: 97% 95% 95% 99%  ?Weight:      ?Height:      ? ?Neurology awake and alert ?ENT with no pallor ?Cardiovascular with S1 and S2 present and rhythmic with no murmurs, gallops or rubs ?Respiratory with no wheezing or rales ?Abdomen soft and non distended ?Positive lower extremity edema + pitting bilaterally.  ?Data Reviewed: ? ? ? ?Family Communication: I  spoke with patient's wife at the bedside, we talked in detail about patient's condition, plan of care and prognosis and all questions were addressed. ? ? ?Disposition: ?Status is: Inpatient ?Remains inpatient appropriate because: uncontrolled blood pressure  ? Planned Discharge Destination: Home ? ? ? ? ?Author: ?Tawni Millers, MD ?11/17/2021 3:17 PM ? ?For on call review [email or web address].  ?

## 2021-11-18 DIAGNOSIS — I509 Heart failure, unspecified: Secondary | ICD-10-CM | POA: Diagnosis not present

## 2021-11-18 DIAGNOSIS — I10 Essential (primary) hypertension: Secondary | ICD-10-CM | POA: Diagnosis not present

## 2021-11-18 DIAGNOSIS — N179 Acute kidney failure, unspecified: Secondary | ICD-10-CM | POA: Diagnosis not present

## 2021-11-18 LAB — BASIC METABOLIC PANEL
Anion gap: 10 (ref 5–15)
BUN: 16 mg/dL (ref 6–20)
CO2: 26 mmol/L (ref 22–32)
Calcium: 9 mg/dL (ref 8.9–10.3)
Chloride: 101 mmol/L (ref 98–111)
Creatinine, Ser: 1.51 mg/dL — ABNORMAL HIGH (ref 0.61–1.24)
GFR, Estimated: 56 mL/min — ABNORMAL LOW (ref >60)
Glucose, Bld: 139 mg/dL — ABNORMAL HIGH (ref 70–99)
Potassium: 3.3 mmol/L — ABNORMAL LOW (ref 3.5–5.1)
Sodium: 137 mmol/L (ref 135–145)

## 2021-11-18 LAB — GLUCOSE, CAPILLARY
Glucose-Capillary: 137 mg/dL — ABNORMAL HIGH (ref 70–99)
Glucose-Capillary: 165 mg/dL — ABNORMAL HIGH (ref 70–99)

## 2021-11-18 MED ORDER — FUROSEMIDE 40 MG PO TABS
40.0000 mg | ORAL_TABLET | Freq: Every day | ORAL | 0 refills | Status: AC
Start: 2021-11-19 — End: ?

## 2021-11-18 MED ORDER — EMPAGLIFLOZIN 10 MG PO TABS
10.0000 mg | ORAL_TABLET | Freq: Every day | ORAL | 0 refills | Status: AC
Start: 2021-11-18 — End: ?

## 2021-11-18 MED ORDER — MELATONIN 5 MG PO TABS: 10.0000 mg | ORAL_TABLET | Freq: Every evening | ORAL | Status: DC | PRN

## 2021-11-18 MED ORDER — AMLODIPINE BESYLATE 10 MG PO TABS
10.0000 mg | ORAL_TABLET | Freq: Every day | ORAL | 0 refills | Status: AC
Start: 2021-11-19 — End: 2021-12-19

## 2021-11-18 MED ORDER — EMPAGLIFLOZIN 10 MG PO TABS
10.0000 mg | ORAL_TABLET | Freq: Every day | ORAL | Status: DC
  Administered 2021-11-18: 10 mg via ORAL
  Filled 2021-11-18: qty 1

## 2021-11-18 MED ORDER — POTASSIUM CHLORIDE CRYS ER 20 MEQ PO TBCR: 20.0000 meq | EXTENDED_RELEASE_TABLET | Freq: Every day | ORAL | Status: DC

## 2021-11-18 MED ORDER — METFORMIN HCL 500 MG PO TABS: 500.0000 mg | ORAL_TABLET | Freq: Every day | ORAL | Status: DC

## 2021-11-18 MED ORDER — METFORMIN HCL 500 MG PO TABS
500.0000 mg | ORAL_TABLET | Freq: Every day | ORAL | 0 refills | Status: AC
Start: 2021-11-19 — End: 2021-12-19

## 2021-11-18 MED ORDER — POTASSIUM CHLORIDE CRYS ER 20 MEQ PO TBCR: 20.0000 meq | EXTENDED_RELEASE_TABLET | Freq: Every day | ORAL | 0 refills | Status: AC

## 2021-11-18 MED ORDER — POTASSIUM CHLORIDE CRYS ER 20 MEQ PO TBCR: 20.0000 meq | EXTENDED_RELEASE_TABLET | Freq: Once | ORAL | Status: DC

## 2021-11-18 MED ORDER — HYDRALAZINE HCL 50 MG PO TABS: 50.0000 mg | ORAL_TABLET | Freq: Two times a day (BID) | ORAL | 0 refills | Status: AC

## 2021-11-18 MED ORDER — POTASSIUM CHLORIDE CRYS ER 20 MEQ PO TBCR
40.0000 meq | EXTENDED_RELEASE_TABLET | Freq: Once | ORAL | Status: AC
  Administered 2021-11-18: 40 meq via ORAL
  Filled 2021-11-18: qty 2

## 2021-11-18 NOTE — Assessment & Plan Note (Signed)
Calculated BMI is 54 ?

## 2021-11-18 NOTE — Discharge Summary (Signed)
?Physician Discharge Summary ?  ?Patient: Kevin Ingram MRN: [id number] DOB: 1971/05/09  ?Admit date:     11/15/2021  ?Discharge date: 11/18/21  ?Discharge Physician: Kevin Ingram  ? ?PCP: Pcp, No  ? ?Recommendations at discharge:  ? ? Patient has been placed on antihypertensive regimen with hydralazine and amlodipine ?Diuresis with furosemide ?Started metformin and empagliflozin ?Follow up renal function and electrolytes as outpatient next week.  ? ?Discharge Diagnoses: ?Principal Problem: ?  CHF (congestive heart failure) ([place]) ?Active Problems: ?  HTN (hypertension) ?  Type 2 diabetes mellitus ([place]) ?  AKI (acute kidney injury) ([place]) ?  Infection due to human metapneumovirus (hMPV) ?  Class 3 obesity (HCC) ? ?Resolved Problems: ?  * No resolved hospital problems. * ? ?Hospital Course: ?Kevin Ingram was admitted to the hospital with the working diagnosis of decompensated heart failure, hypertensive emergency  ? ?51 yo male with the past medical history of hypertension, type 2 diabetes mellitus, obesity and dyslipidemia who presented with dyspnea. Reported 3 to 4 months of dyspnea on exertion, that progressed to severe productive cough for the last 3 days that prompted him to come to the hospital. He stop taking his medications several months ago. On his initial physical examination his blood pressure was 237/103, HR 98, RR 13 02 saturation 99%, lungs with no wheezing or rhonchi, heart with S1 and S2 present and rhythmic, with no gallops, abdomen not distended, positive lower extremity edema +++ pitting bilaterally.  ? ?Na 137, K 3,4 CL 102, bicarbonate 27, glucose 180 bun 21 cr 1,84 ?BNP 164 ?High sensitive troponin 49 and 46  ?Wbc 5.7 hgb 13,8 plt 127  ?Metapneumovirus positive ?SARS covid 19 negative ? ?Chest radiograph with cardiomegaly with bilateral hilar vascular congestion.  ? ?EKG 96 bpm, right axis, normal intervals, sinus rhythm, ST depression in V5 and V6, T wave inversions, lead II, III, AvF, V5  and V6. ? ?Patient was placed on furosemide for diuresis.  ? ?Echocardiogram with LV systolic function preserved with EF 50 to 55%, severe concentric hypertrophy, preserved RV systolic function.  ? ?Resume antihypertensive medications with improvement in his blood pressure, will need close follow up as outpatient.  ? ?Assessment and Plan: ?* CHF (congestive heart failure) ([place]) ?Patient was admitted to the cardiac ward and underwent diuresis with IV furosemide, negative fluid balanced was achieved with improvement in her symptoms.  ? ?Echocardiogram with preserved LV systolic function, EF 50 to 55%, with severe LVH, preserved RV systolic function and no significant valvular disease.  ? ?At his discharge patient will continue with furosemide 40 mg daily for diuresis, continue blood pressure control with amlodipine and hydralazine. ?Added SGLT2i. ? ?Follow with primary care within 7 days.  ? ? ? ?HTN (hypertension) ?Hypertensive emergency.  ? ?His discharge blood pressure is 149/81. ?Plan to continue blood pressure control with amlodipine, hydralazine and diuretic therapy with furosemide.  ? ?Follow up with primary care in 7 days.  ? ?Type 2 diabetes mellitus ([place]) ?Hgb A1c is 8,2. ?Fasting glucose is 184. ? ?His fasting glucose is 139 mg/dl ?Patient was placed on insulin sliding scale during his hospitalization. ?At the time of his discharge with start with metformin. ?Advices about life style modifications.  ? ?AKI (acute kidney injury) ([place]) ?Baseline cr is 1,26 stage 2 CKD ?Hypokalemia.  ? ?At the time of his discharge his renal function has a serum cr of 1,51, K 3,3 and serum bicarbonate at 26. ?His volume has improved.  ?Plan to continue with  furosemide and K supplements, follow up renal function and electrolytes as outpatient within the next 7 days.  ? ?Infection due to human metapneumovirus (hMPV) ?Positive for metapneumovirus, no clinical signs of viral pneumonia.  ?(sepsis criteria present on admission).   ? ? ? ?Class 3 obesity (HCC) ?Calculated BMI is 54 ? ? ? ? ?  ? ? ?Consultants: none  ?Procedures performed: non  ?Disposition: Home ?Diet recommendation:  ?Discharge Diet Orders (From admission, onward)  ? ?  Start     Ordered  ? 11/18/21 0000  Diet - low sodium heart healthy       ? 11/18/21 1017  ? ?  ?  ? ?  ? ?Carb modified diet ?DISCHARGE MEDICATION: ?Allergies as of 11/18/2021   ?No Known Allergies ?  ? ?  ?Medication List  ?  ? ?TAKE these medications   ? ?amLODipine 10 MG tablet ?Commonly known as: NORVASC ?Take 1 tablet (10 mg total) by mouth daily. ?Start taking on: November 19, 2021 ?  ?empagliflozin 10 MG Tabs tablet ?Commonly known as: JARDIANCE ?Take 1 tablet (10 mg total) by mouth daily. ?  ?furosemide 40 MG tablet ?Commonly known as: LASIX ?Take 1 tablet (40 mg total) by mouth daily. ?Start taking on: November 19, 2021 ?  ?hydrALAZINE 50 MG tablet ?Commonly known as: APRESOLINE ?Take 1 tablet (50 mg total) by mouth 2 (two) times daily. ?  ?metFORMIN 500 MG tablet ?Commonly known as: GLUCOPHAGE ?Take 1 tablet (500 mg total) by mouth daily with breakfast. ?Start taking on: November 19, 2021 ?  ?NyQuil HBP Cold & Flu 15-6.25-325 MG/15ML Liqd ?Generic drug: DM-Doxylamine-Acetaminophen ?Take 15 mLs by mouth every 6 (six) hours as needed (cold and cough). ?  ?potassium chloride SA 20 MEQ tablet ?Commonly known as: KLOR-CON M ?Take 1 tablet (20 mEq total) by mouth daily. ?Start taking on: November 19, 2021 ?  ? ?  ? ? ?Discharge Exam: ?Filed Weights  ? 11/16/21 1638 11/18/21 0459  ?Weight: (!) 146.3 kg (!) 147.2 kg  ? ?Patient is feeling better, dyspnea and edema have improved.  ? ?BP (!) 149/81 (BP Location: Right Arm)   Pulse 91   Temp 98.5 ?F (36.9 ?C) (Oral)   Resp 18   Ht 5\' 5"  (1.651 m)   Wt (!) 147.2 kg   SpO2 97%   BMI 54.00 kg/m?  ? ?Neurology awake and alert ?ENT with no pallor ?Cardiovascular with S1 and S2 present with no gallops and rubs, no murmurs ?Respiratory with no wheezing or rales ?Abdomen  soft  ?Trace lower extremity edema  ? ?Condition at discharge: stable ? ?The results of significant diagnostics from this hospitalization (including imaging, microbiology, ancillary and laboratory) are listed below for reference.  ? ?Imaging Studies: ?DG Chest 2 View ? ?Result Date: 11/15/2021 ?CLINICAL DATA:  Shortness of breath. EXAM: CHEST - 2 VIEW COMPARISON:  None. FINDINGS: The cardiac silhouette is enlarged. The lungs are clear. There is no pleural effusion or pneumothorax. No acute fractures are seen. IMPRESSION: 1. Cardiomegaly. 2. Lungs are clear. Electronically Signed   By: Darliss Cheney M.D.   On: 11/15/2021 17:31  ? ?ECHOCARDIOGRAM COMPLETE ? ?Result Date: 11/16/2021 ?   ECHOCARDIOGRAM REPORT   Patient Name:   ROLLA HAGIE Date of Exam: 11/16/2021 Medical Rec #:  [id number]       Height: Accession #:    [id number]      Weight: Date of Birth:  [birth year]        BSA: Patient  Age:    51 years        BP:           178/99 mmHg Patient Gender: M               HR:           86 bpm. Exam Location:  Inpatient Procedure: 2D Echo Indications:    acute systolic chf  History:        Patient has no prior history of Echocardiogram examinations.                 Risk Factors:Diabetes and Hypertension.  Sonographer:    Johny Chess RDCS Referring Phys: [id number] [place]  1. Left ventricular ejection fraction, by estimation, is 50 to 55%. The left ventricle has low normal function. The left ventricle has no regional wall motion abnormalities. There is severe concentric left ventricular hypertrophy. Left ventricular diastolic parameters are consistent with Grade II diastolic dysfunction (pseudonormalization).  2. Right ventricular systolic function is normal. The right ventricular size is not well visualized. There is moderately elevated pulmonary artery systolic pressure.  3. Left atrial size was moderately dilated.  4. The mitral valve is grossly normal. Trivial mitral valve regurgitation. No evidence of  mitral stenosis.  5. The aortic valve is grossly normal. There is mild calcification of the aortic valve. Aortic valve regurgitation is trivial. Aortic valve sclerosis is present, with no evidence of aortic valve st

## 2023-03-14 IMAGING — DX DG CHEST 2V
2 series · 2 of 2 positions shown · non-contrast
Comparison: None.

CLINICAL DATA: Shortness of breath.

EXAM:
CHEST - 2 VIEW

[chest pa]
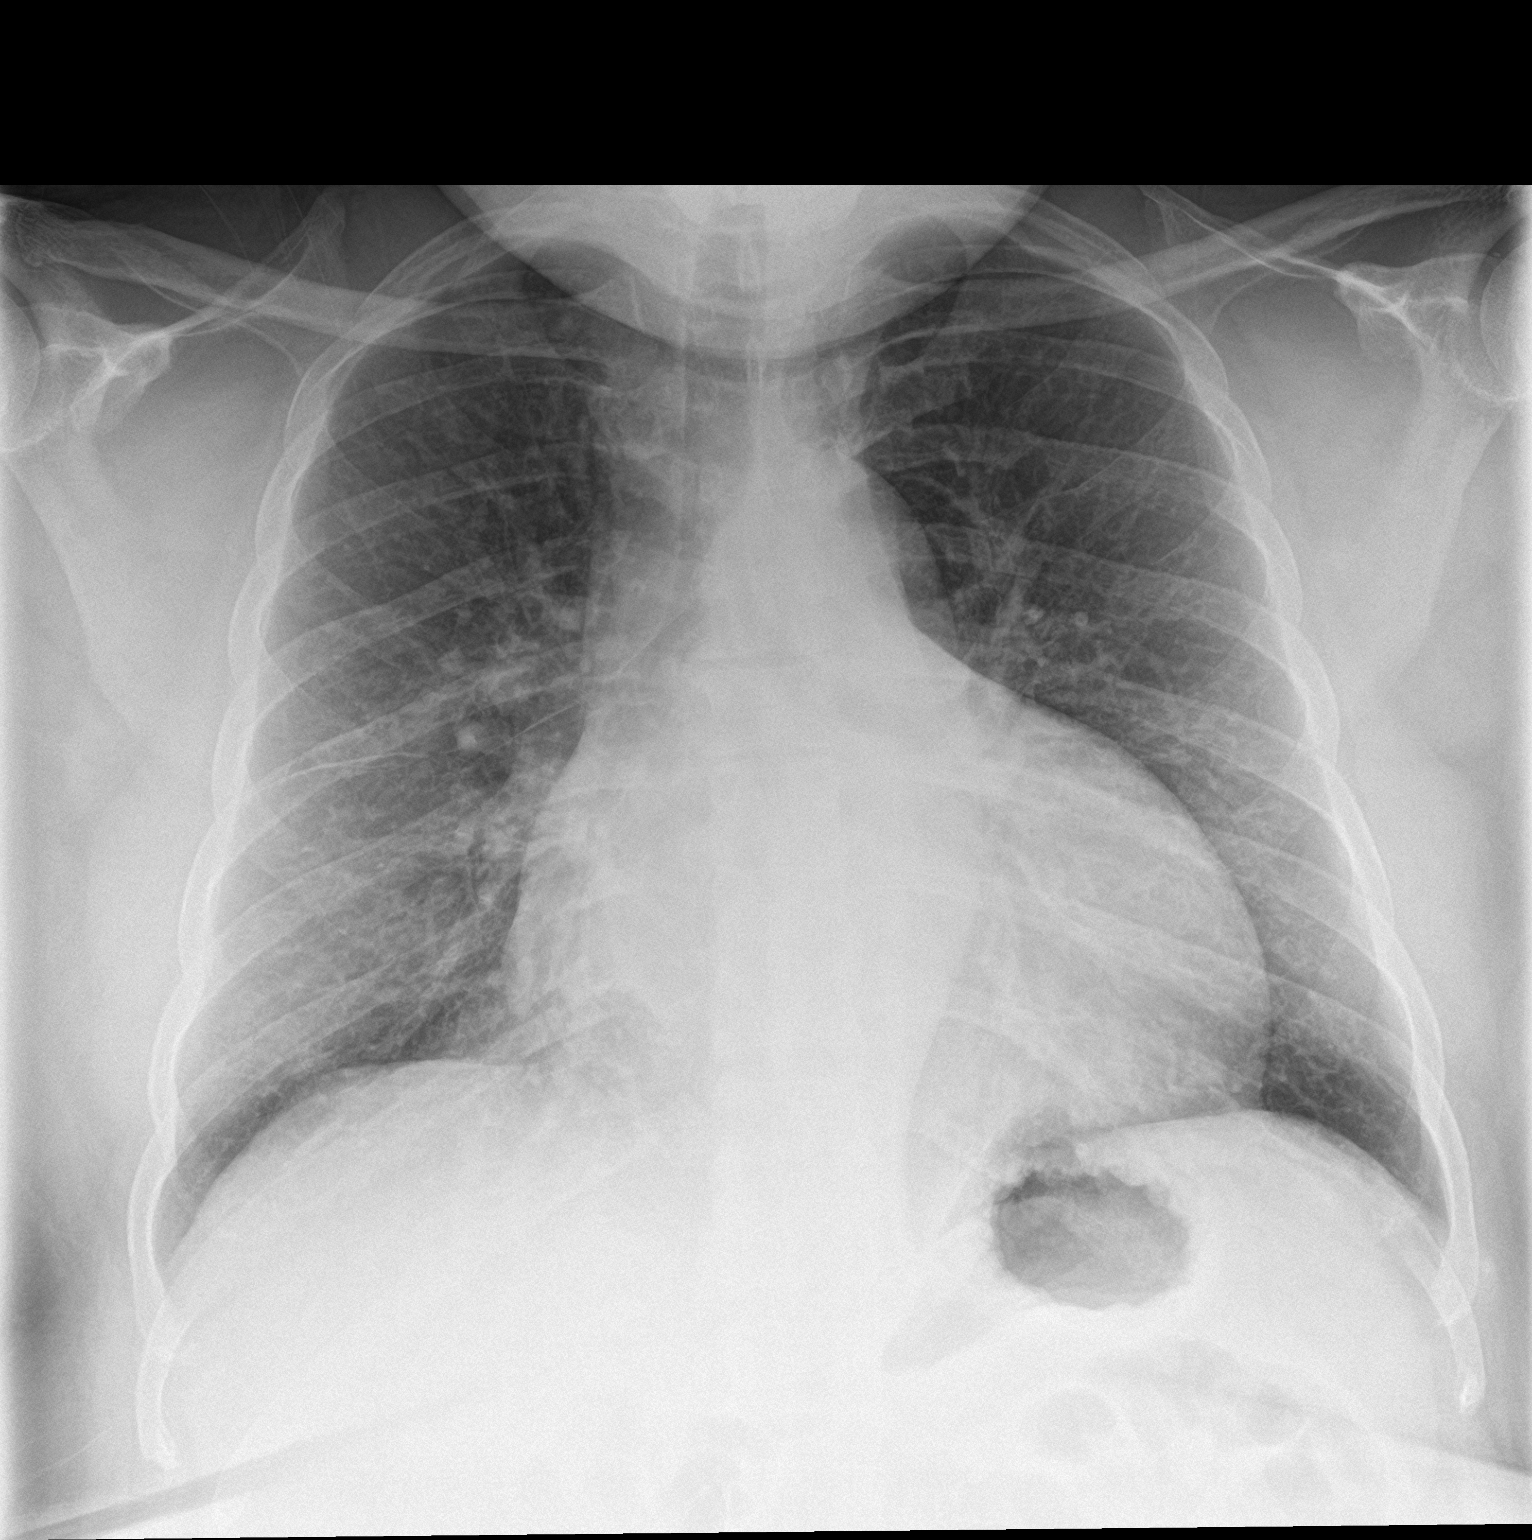

[chest lat]
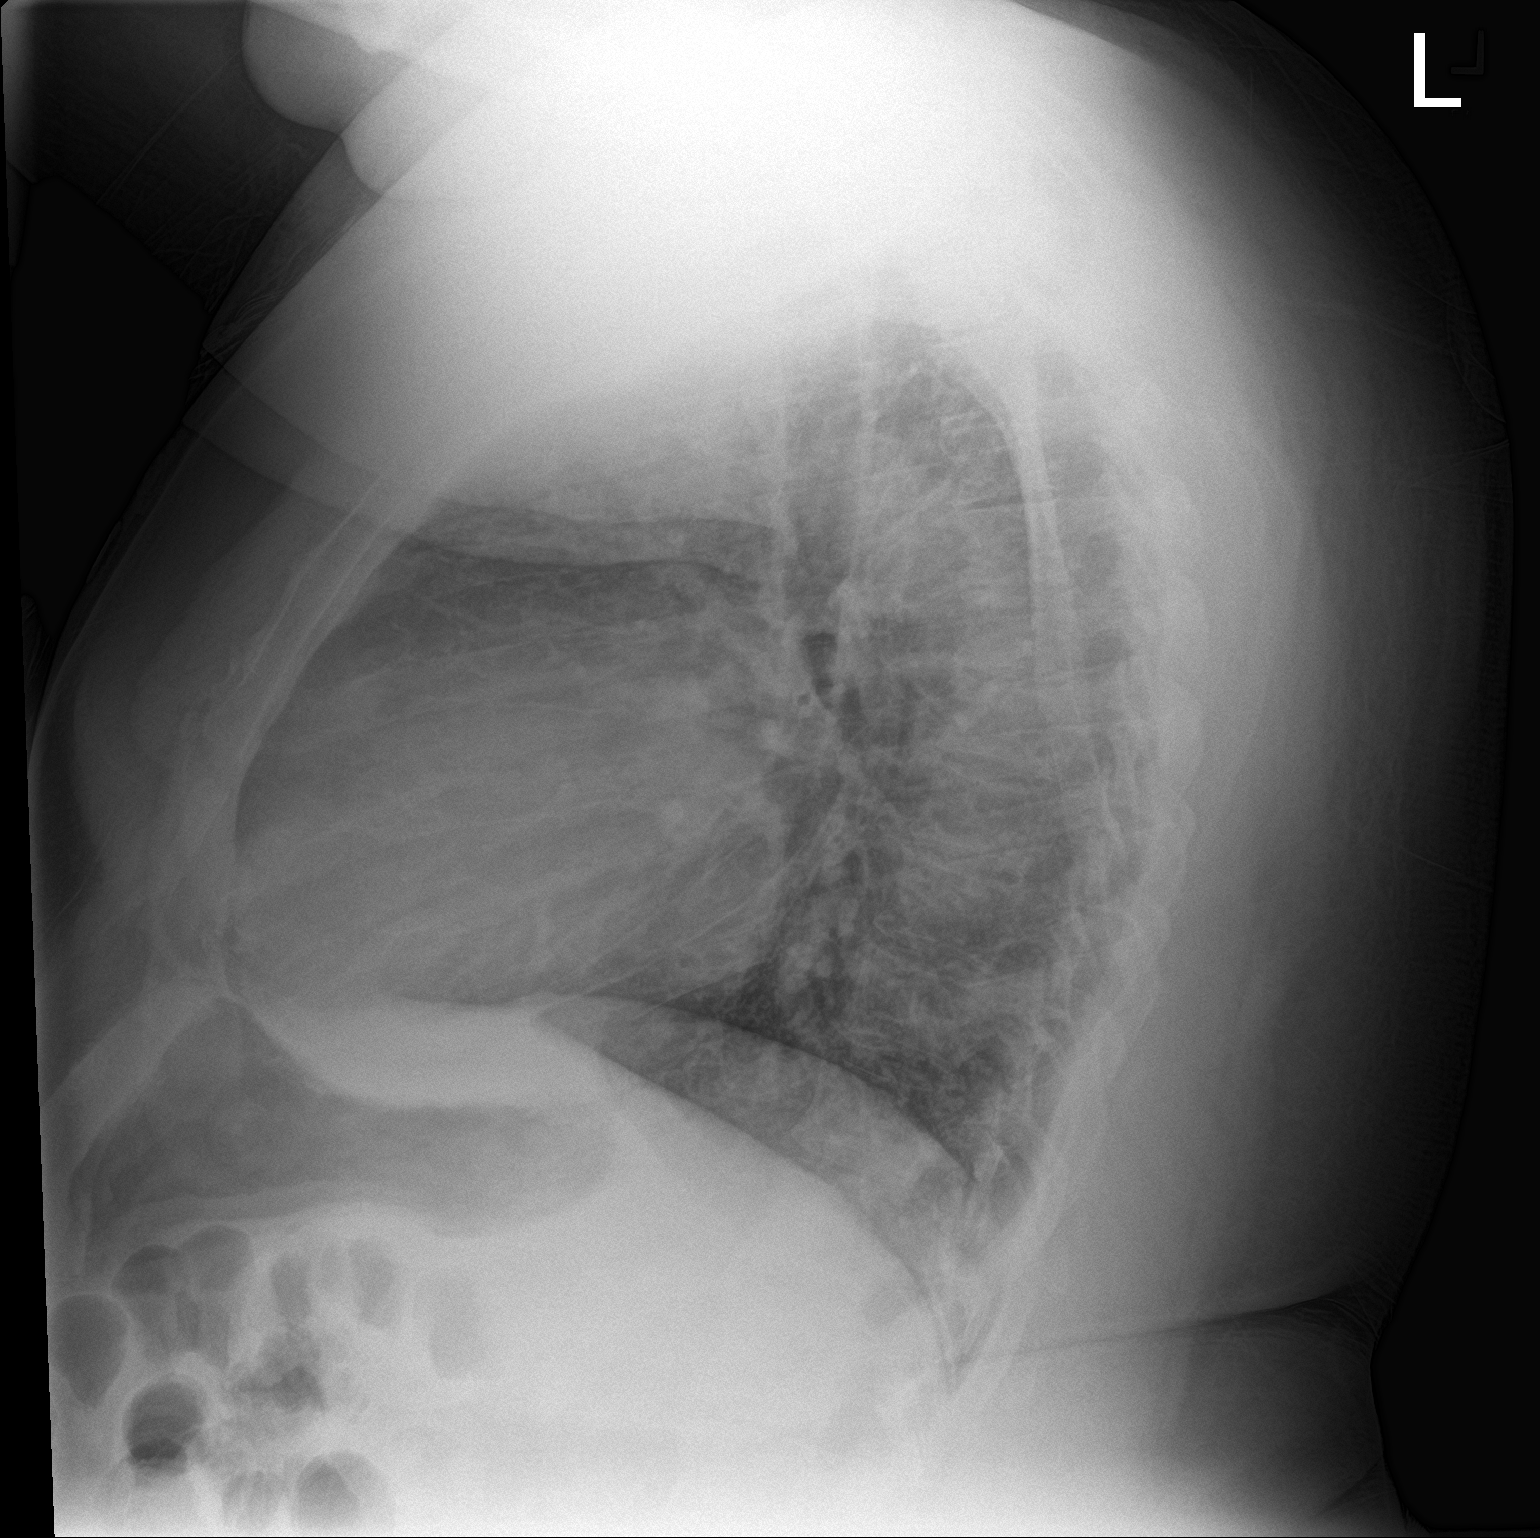

[2 of 2 positions shown; findings below may reference images not displayed]

FINDINGS: The cardiac silhouette is enlarged. The lungs are clear. There is no
pleural effusion or pneumothorax. No acute fractures are seen.
IMPRESSION: 1. Cardiomegaly.
2. Lungs are clear.

## 2023-11-14 ENCOUNTER — Telehealth: Payer: Self-pay | Admitting: Gastroenterology

## 2023-11-14 NOTE — Telephone Encounter (Signed)
 Patient is not happy with his last colon but to him having a poor prep. Patient is wanting to schedule with one of our providers here. Advised patient that we would need to do a transfer of care before being able to get him scheduled. Patient is not able to obtain those records, I will reach out to see if I can obtain them.

## 2023-11-18 ENCOUNTER — Telehealth: Payer: Self-pay | Admitting: Gastroenterology

## 2023-11-18 NOTE — Telephone Encounter (Signed)
 Good morning Dr. Chales Abrahams,   DOD 4/8 AM   We received a referral for patient to have a colonoscopy. Patient last had a colonoscopy in 2023 with [facility]. Patient is requesting to transfer his care due to being unhappy with care provided previously. Patient's previous records are in [employer] for you to review and advise on scheduling.   Thank you.
